# Patient Record
Sex: Male | Born: 1955 | Race: White | Hispanic: No | Marital: Married | Smoking: Former smoker
Health system: Southern US, Community
[De-identification: ages and names within clinical notes are randomized; demographics above are authoritative.]

## PROBLEM LIST (undated history)

## (undated) DIAGNOSIS — F419 Anxiety disorder, unspecified: Secondary | ICD-10-CM

## (undated) DIAGNOSIS — I10 Essential (primary) hypertension: Secondary | ICD-10-CM

## (undated) HISTORY — DX: Essential (primary) hypertension: I10

## (undated) HISTORY — DX: Anxiety disorder, unspecified: F41.9

## (undated) HISTORY — PX: SPINAL FUSION: SHX223

---

## 2014-02-08 ENCOUNTER — Observation Stay
Admission: EM | Admit: 2014-02-08 | Discharge: 2014-02-09 | Disposition: A | Discharge status: Home / Self Care | Payer: Medicare Other | Attending: Internal Medicine | Admitting: Internal Medicine

## 2014-02-08 ENCOUNTER — Emergency Department: Payer: Medicare Other

## 2014-02-08 ENCOUNTER — Observation Stay: Payer: Self-pay | Admitting: Internal Medicine

## 2014-02-08 DIAGNOSIS — R079 Chest pain, unspecified: Secondary | ICD-10-CM | POA: Diagnosis present

## 2014-02-08 DIAGNOSIS — I4510 Unspecified right bundle-branch block: Secondary | ICD-10-CM | POA: Insufficient documentation

## 2014-02-08 DIAGNOSIS — I498 Other specified cardiac arrhythmias: Secondary | ICD-10-CM | POA: Insufficient documentation

## 2014-02-08 DIAGNOSIS — F411 Generalized anxiety disorder: Secondary | ICD-10-CM | POA: Insufficient documentation

## 2014-02-08 DIAGNOSIS — R0789 Other chest pain: Principal | ICD-10-CM | POA: Insufficient documentation

## 2014-02-08 DIAGNOSIS — Z87891 Personal history of nicotine dependence: Secondary | ICD-10-CM | POA: Insufficient documentation

## 2014-02-08 DIAGNOSIS — I10 Essential (primary) hypertension: Secondary | ICD-10-CM | POA: Insufficient documentation

## 2014-02-08 DIAGNOSIS — F410 Panic disorder [episodic paroxysmal anxiety] without agoraphobia: Secondary | ICD-10-CM | POA: Insufficient documentation

## 2014-02-08 DIAGNOSIS — F419 Anxiety disorder, unspecified: Secondary | ICD-10-CM

## 2014-02-08 DIAGNOSIS — Z8249 Family history of ischemic heart disease and other diseases of the circulatory system: Secondary | ICD-10-CM | POA: Insufficient documentation

## 2014-02-08 LAB — BASIC METABOLIC PANEL
Anion Gap: 13 (ref 5.0–15.0)
BUN: 14 mg/dL (ref 9–28)
CO2: 22 mEq/L (ref 22–29)
Calcium: 9.9 mg/dL (ref 8.5–10.5)
Chloride: 107 mEq/L (ref 100–111)
Creatinine: 0.9 mg/dL (ref 0.7–1.3)
Glucose: 105 mg/dL — ABNORMAL HIGH (ref 70–100)
Potassium: 3.9 mEq/L (ref 3.5–5.1)
Sodium: 142 mEq/L (ref 136–145)

## 2014-02-08 LAB — CBC AND DIFFERENTIAL
Basophils Absolute Automated: 0.03 10*3/uL (ref 0.00–0.20)
Basophils Automated: 0 %
Eosinophils Absolute Automated: 0.22 10*3/uL (ref 0.00–0.70)
Eosinophils Automated: 3 %
Hematocrit: 39.7 % — ABNORMAL LOW (ref 42.0–52.0)
Hgb: 14.6 g/dL (ref 13.0–17.0)
Immature Granulocytes Absolute: 0.02 10*3/uL
Immature Granulocytes: 0 %
Lymphocytes Absolute Automated: 2.36 10*3/uL (ref 0.50–4.40)
Lymphocytes Automated: 30 %
MCH: 31.5 pg (ref 28.0–32.0)
MCHC: 36.8 g/dL — ABNORMAL HIGH (ref 32.0–36.0)
MCV: 85.7 fL (ref 80.0–100.0)
MPV: 10.2 fL (ref 9.4–12.3)
Monocytes Absolute Automated: 0.31 10*3/uL (ref 0.00–1.20)
Monocytes: 4 %
Neutrophils Absolute: 4.86 10*3/uL (ref 1.80–8.10)
Neutrophils: 62 %
Nucleated RBC: 0 /100 WBC (ref 0–1)
Platelets: 173 10*3/uL (ref 140–400)
RBC: 4.63 10*6/uL — ABNORMAL LOW (ref 4.70–6.00)
RDW: 13 % (ref 12–15)
WBC: 7.78 10*3/uL (ref 3.50–10.80)

## 2014-02-08 LAB — CK: Creatine Kinase (CK): 107 U/L (ref 47–267)

## 2014-02-08 LAB — GFR: EGFR: >60

## 2014-02-08 LAB — TROPONIN I: Troponin I: 0.01 ng/mL (ref 0.00–0.09)

## 2014-02-08 MED ORDER — LORAZEPAM 2 MG/ML IJ SOLN
0.5000 mg | Freq: Once | INTRAMUSCULAR | Status: AC
Start: 2014-02-08 — End: 2014-02-08
  Administered 2014-02-08: 0.5 mg via INTRAVENOUS
  Filled 2014-02-08: qty 1

## 2014-02-08 MED ORDER — HYDROMORPHONE HCL PF 1 MG/ML IJ SOLN
0.5000 mg | Freq: Once | INTRAMUSCULAR | Status: DC
Start: 2014-02-08 — End: 2014-02-08
  Filled 2014-02-08: qty 1

## 2014-02-08 MED ORDER — ASPIRIN 81 MG PO CHEW
162.0000 mg | CHEWABLE_TABLET | Freq: Once | ORAL | Status: AC
Start: 2014-02-08 — End: 2014-02-08
  Administered 2014-02-08: 162 mg via ORAL
  Filled 2014-02-08: qty 2

## 2014-02-08 MED ORDER — AMLODIPINE BESYLATE 5 MG PO TABS
5.0000 mg | ORAL_TABLET | Freq: Every day | ORAL | Status: DC
Start: 2014-02-09 — End: 2014-02-09
  Administered 2014-02-09: 5 mg via ORAL
  Filled 2014-02-08: qty 1

## 2014-02-08 MED ORDER — ACETAMINOPHEN 325 MG PO TABS
650.0000 mg | ORAL_TABLET | ORAL | Status: DC | PRN
Start: 2014-02-08 — End: 2014-02-09

## 2014-02-08 MED ORDER — HYDROCHLOROTHIAZIDE 25 MG PO TABS
25.0000 mg | ORAL_TABLET | Freq: Every day | ORAL | Status: DC
Start: 2014-02-09 — End: 2014-02-09
  Administered 2014-02-09: 25 mg via ORAL
  Filled 2014-02-08: qty 1

## 2014-02-08 MED ORDER — ONDANSETRON HCL 4 MG/2ML IJ SOLN
4.0000 mg | Freq: Once | INTRAMUSCULAR | Status: DC
Start: 2014-02-08 — End: 2014-02-08
  Filled 2014-02-08: qty 2

## 2014-02-08 MED ORDER — MORPHINE SULFATE 2 MG/ML IJ/IV SOLN (WRAP)
1.0000 mg | Status: DC | PRN
Start: 2014-02-08 — End: 2014-02-09

## 2014-02-08 MED ORDER — METOPROLOL TARTRATE 25 MG PO TABS
12.5000 mg | ORAL_TABLET | Freq: Two times a day (BID) | ORAL | Status: DC
Start: 2014-02-08 — End: 2014-02-08

## 2014-02-08 MED ORDER — ALPRAZOLAM 0.25 MG PO TABS
0.2500 mg | ORAL_TABLET | Freq: Two times a day (BID) | ORAL | Status: DC | PRN
Start: 2014-02-08 — End: 2014-02-09

## 2014-02-08 MED ORDER — ASPIRIN 81 MG PO CHEW
81.0000 mg | CHEWABLE_TABLET | Freq: Every day | ORAL | Status: DC
Start: 2014-02-09 — End: 2014-02-09
  Administered 2014-02-09: 81 mg via ORAL
  Filled 2014-02-08: qty 1

## 2014-02-08 NOTE — ED Notes (Signed)
Family member reports pt. Has been dealing with anxiety recently. Pt. Tearful in triage. States pt. Has had episodes of panic in the past, but this incident was lasted several hours. Denies SI

## 2014-02-08 NOTE — ED Provider Notes (Signed)
Physician/Midlevel provider first contact with patient: 02/08/14 1925         [facility] EMERGENCY DEPARTMENT HISTORY AND PHYSICAL EXAM    Patient Name: Aaron Mayo, Aaron Mayo  Encounter Date:  02/08/2014  Rendering Provider: Dorethea Clan, MD  Patient DOB:  1956-04-24  MRN:  [id number]    History of Presenting Illness     Historian: Patient  Onset: Gradual    58 y.o. male h/o anxiety and HTN presents for evaluation of a panic attack that began just PTA. Associated with CP and abd pain (resolved). Reports the episode of chest pain lasted for three seconds and radiated to his jaw before spontaneously resolving 3 hours PTA. States that he is under a significant amount of stress related to his job. Denies SI, HI, alcohol or drug use, or previous issues with addiction.     PMD:  Pcp, [facility], MD    Past Medical History     Past Medical History   Diagnosis Date   . Anxiety    . Hypertension        Past Surgical History     Past Surgical History   Procedure Laterality Date   . Spinal fusion         Family History     No family history on file.    Social History     History     Social History   . Marital Status: Married     Spouse Name: N/A     Number of Children: N/A   . Years of Education: N/A     Social History Main Topics   . Smoking status: Never Smoker    . Smokeless tobacco: Not on file   . Alcohol Use: No   . Drug Use: No   . Sexual Activity: Not on file     Other Topics Concern   . Not on file     Social History Narrative   . No narrative on file       Home Medications     Home medications reviewed by ED MD at 7:25 PM     Previous Medications    HYDROCHLOROTHIAZIDE (HYDRODIURIL) 12.5 MG TABLET    Take 12.5 mg by mouth daily.         Review of Systems     CV:  + CP radiating to jaw  GI: + abd pain  Psych:  No SI, HI, or substance abuse  All other systems reviewed and negative     Physical Exam     CONSTITUTIONAL Patient is afebrile, Vital signs reviewed, Tearful, Alert and oriented X 3.  HEAD Atraumatic,  Normocephalic.  EYES Eyes are normal to inspection, PERRL, No discharge from eyes,  Extraocular muscles intact, Sclera are normal, Conjunctiva are normal.  ENT Posterior pharynx normal, Mouth normal to inspection.  NECK Normal ROM, No meningeal signs, Cervical spine nontender.  RESPIRATORY CHEST Breath sounds normal, No respiratory distress.  CARDIOVASCULAR   RRR, No murmurs, Normal S1 S2, No rub.  ABDOMEN Abdomen is nontender, No distension.  UPPER EXTREMITY Inspection normal, Normal range of motion.   LOWER EXTREMITY Inspection normal, Normal range of motion.   NEURO GCS is 15  SKIN Skin is warm. Skin is dry.   PSYCHIATRIC Oriented X 3, Normal affect.    ED Medications Administered     ED Medication Orders    Start     Status Ordering Provider    02/08/14 2100  aspirin chewable tablet 162 mg  Once     Route: Oral  Ordered Dose: 162 mg     Ordered Neita Carp, Carlinda Ohlson E    02/08/14 2056  LORazepam (ATIVAN) injection 0.5 mg   Once     Route: Intravenous  Ordered Dose: 0.5 mg     Peyton Bottoms, Naasia Weilbacher E    02/08/14 1959     Once,   Status:  Discontinued     Route: Intravenous  Ordered Dose: 0.5 mg     Discontinued Neita Carp, Aylen Stradford E    02/08/14 1959     Once,   Status:  Discontinued     Route: Intravenous  Ordered Dose: 4 mg     Discontinued Jaclynne Baldo E    02/08/14 1956  LORazepam (ATIVAN) injection 0.5 mg   Once     Route: Intravenous  Ordered Dose: 0.5 mg     Last MAR action:  Given Rafiel Mecca E          Orders Placed During This Encounter     Orders Placed This Encounter   Procedures   . Chest AP Portable   . Basic Metabolic Panel   . CBC with differential   . CK with MB if indicated   . Troponin I   . GFR   . Cardiac monitoring   . ECG 12 lead   . Saline lock IV   . Einar Gip ED Bed Request       Diagnostic Study Results     The results of the diagnostic studies below were reviewed by the ED provider:    Labs  Results    Procedure Component Value Units Date/Time    Basic Metabolic Panel [[id number]]  (Abnormal)  Collected:  02/08/14 2017    Specimen Information:  Blood Updated:  02/08/14 2045     Glucose 105 (H) mg/dL      BUN 14 mg/dL      Creatinine 0.9 mg/dL      CALCIUM 9.9 mg/dL      Sodium 409 mEq/L      Potassium 3.9 mEq/L      Chloride 107 mEq/L      CO2 22 mEq/L      Anion Gap 13.0     CK with MB if indicated [[id number]] Collected:  02/08/14 2017    Specimen Information:  Blood Updated:  02/08/14 2045     Creatine Kinase (CK) 107 U/L     Troponin I [[id number]] Collected:  02/08/14 2017    Specimen Information:  Blood Updated:  02/08/14 2045     Troponin I 0.01 ng/mL     GFR [[id number]] Collected:  02/08/14 2017     EGFR >60.0 Updated:  02/08/14 2045    CBC with differential [[id number]]  (Abnormal) Collected:  02/08/14 2017    Specimen Information:  Blood / Blood Updated:  02/08/14 2026     WBC 7.78 x10 3/uL      RBC 4.63 (L) x10 6/uL      Hgb 14.6 g/dL      Hematocrit 01.0 (L) %      MCV 85.7 fL      MCH 31.5 pg      MCHC 36.8 (H) g/dL      RDW 13 %      Platelets 173 x10 3/uL      MPV 10.2 fL      Neutrophils 62 %      Lymphocytes Automated 30 %      Monocytes 4 %  Eosinophils Automated 3 %      Basophils Automated 0 %      Immature Granulocyte 0 %      Nucleated RBC 0 /100 WBC      Neutrophils Absolute 4.86 x10 3/uL      Abs Lymph Automated 2.36 x10 3/uL      Abs Mono Automated 0.31 x10 3/uL      Abs Eos Automated 0.22 x10 3/uL      Absolute Baso Automated 0.03 x10 3/uL      Absolute Immature Granulocyte 0.02 x10 3/uL           Radiologic Studies  Radiology Results (24 Hour)    Procedure Component Value Units Date/Time    Chest AP Portable [[id number]] Collected:  02/08/14 2021    Order Status:  Completed  Updated:  02/08/14 2025    Narrative:      History:  Chest pain. Hypertension.    Portable Chest: The heart size and mediastinum appear normal for the AP  projection and patient position.  The lungs are clear, with normal  pulmonary vascularity.  No pleural effusion, pneumothorax or acute  fracture is  evident on this study.      Impression:        Normal portable chest.          Nelta Numbers, MD   02/08/2014 8:21 PM            Scribe and MD Attestations     I, Dorethea Clan, MD, personally performed the services documented. Adonis Huguenin is scribing for me on [employer]. I reviewed and confirm the accuracy of the information in this medical record.    I, Adonis Huguenin, am serving as a scribe to document services personally performed by Dorethea Clan, MD, based on the provider's statements to me.     Rendering Provider: Dorethea Clan, MD    Monitors, EKG, Critical Care, and Splints     EKG (interpreted by ED physician): NSR, RBBB, Flipped T waves in V2 and V3. No old EKG for comparison.  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 Will admit. Discussed treatment plan.     Consults:  @2100 : D/w Dr. Cherylin Mylar, hospitalist, who will admit.     Diagnosis and Disposition     Clinical Impression  1. Chest pain    2. Anxiety        Disposition  ED Disposition    Admit Bed Type: Telemetry [5]  Admitting Physician: Shelby Mattocks [[id number]]  Patient Class: Observation [104]            Prescriptions       New Prescriptions    No medications on file               Dorethea Clan, MD  02/13/14 1311

## 2014-02-08 NOTE — Progress Notes (Signed)
Pt admitted to CDU from ED in stable condition.  Pt oriented to room, call bell, bed controls and unit. A&Ox4. VSS. Denies CP or SOB at present time. Pt informed of plan of care, including hourly rounding. Pt verbalizes understanding of plan. RN will continue to monitor.

## 2014-02-08 NOTE — H&P (Signed)
Patient Type: V     ATTENDING PHYSICIAN: Andi Hence, MD     CHIEF COMPLAINT:  Panic attack and chest pain.     HISTORY OF PRESENT ILLNESS:  The patient is 58 years old, with history of anxiety, hypertension, who  presented for further evaluation of the above symptoms.  The patient  reports that he is under a lot of stress at work.  He remodels houses for a  living, and he reports that he has been under a lot of stress lately.  He  came in to the ER today after experiencing symptoms of a panic attack that  began prior to coming to the ER.  He reports that he was feeling very  anxious, and he also had chest pain which was located on the left side of  the chest and was radiating to his jaw, which lasted for about 3 seconds.   The patient reports that he did not have any other associated symptoms.  He  reports the chest pain resolved.  The patient denies being suicidal or  homicidal.  He denies any substance abuse or any previous issues with  addiction.  At the time of my evaluation, he reports feeling much better.   He reports he has not had any recurrence of the chest pain.  He is now  feeling calm and sleepy.  He reports that he is not feeling panicky  anymore.     PAST MEDICAL HISTORY:  History of anxiety and hypertension and panic attacks.     PAST SURGICAL HISTORY:  He has had spinal fusion, remotely.     HOME MEDICATIONS:  He reports he takes hydrochlorothiazide 25 mg daily, and he also takes baby  aspirin daily, and he reports he takes another blood pressure medication  that he does not recall the name.     ALLERGIES:  No known drug allergies.     SOCIAL HISTORY:  He works in remodeling houses.  He is denying any tobacco use, alcohol use,  or drug use.     FAMILY HISTORY:  He denies any significant medical conditions in the family.  He denies any  history of heart attacks or suicidal ideation.     REVIEW OF SYSTEMS:  Constitutional, HEENT, pulmonary, cardiovascular, gastrointenstinal,  genitourinary,  musculoskeletal, endocrine, neurologic, psychiatric, ID,  hematologic, endocrine negative except as mentioned per history of present  illness.  Rest of review of systems is negative except as mentioned per  history of present illness.     PHYSICAL EXAMINATION:  VITAL SIGNS:  Blood pressure of 155/95, pulse of 68, respiratory rate of  15, temperature 97.9, saturating at 98%.  GENERAL:  He is sleepy but easily aroused.  He is in no acute distress.  By  the emergency room physician, when he initially presented, patient was  anxious and tearful.  Currently, he is calm and sleepy, in no acute  distress.  SKIN:  Negative for any rashes or bruises.  HEENT:  Within normal limits.  No pharyngeal erythema.  Normocephalic,  atraumatic.  Pupils equal, round, and reactive to light.  NECK:  Supple, no lymphadenopathy, no thyromegaly, no JVD.  LUNGS:  Clear to auscultation bilaterally, no wheezing, no crackles, no  rhonchi.  CARDIOVASCULAR:  Regular rate and rhythm, no murmurs, rubs, or gallops.  ABDOMEN:  Soft, nondistended, nontender, normoactive bowel sounds.  No  rigidity, no guarding, no rebound.  EXTREMITIES:  No edema, no clubbing, no cyanosis.  NEUROLOGIC:  He is sleepy but  arousable.  He has no focal findings on  neurologic exam.  PSYCHIATRIC:  Appropriate affect.     LABORATORY AND DIAGNOSTIC DATA:  White count of 7.78, hemoglobin and hematocrit of 14.6 and 39.7, platelets  of 173.  Sodium and potassium of 142 and 3.9, chloride and bicarb of 107  and 22, BUN and creatinine of 14 and 0.9.  Troponin 0.01, CK 107.  Chest  x-ray normal.  EKG showed incomplete right bundle branch block and sinus  bradycardia.  There are no old EKGs to compare to.     ASSESSMENT AND PLAN:  The patient is 59 years old with history of panic attacks, anxiety,  hypertension, who presented with a transient episode of left-sided chest  pain.  The patient also reports being under a lot of stress at work and  apparently was noted to be tearful in the  emergency room.  1.  Transient left-sided chest pain; possibly this could be due to anxiety.   The patient reports the symptoms only lasted for a few seconds, resolved  since admission, and has not had recurrence of symptoms.  The patient was given  Ativan in the ER and aspirin.  He reports that symptoms resolved.  He is  not feeling anxious anymore, and currently, he is feeling fine.  The  patient will be observed in the CDU unit.  We will check serial enzymes,  and he will be monitored on telemetry.  We will continue with home regimen  of hydrochlorothiazide, will also put him on aspirin and low-dose Lopressor  for hypertension.    2.  For hypertension:  Blood pressures are noted to be elevated; could be  related to anxiety.  He will be continued on his home regimen of  hydrochlorothiazide.  He does not recall the second medication he takes.   He will be placed on low-dose Lopressor for now.  3.  For anxiety and panic attacks.  Currently, he reports feeling fine.  He  reports being under a lot of stress at work.  He denies any suicidal  ideation or homicidal ideation.  He denies being depressed.  He will be  given Xanax p.r.n. anxiety.  He will need to follow up with primary care  doctor and/or psychiatrist outpatient.  Further management will depend on  patient's clinical progress.           D:  02/08/2014 23:10 PM by Dr. Nadara Mustard. Cherylin Mylar, MD ([id number])  T:  02/08/2014 23:40 PM by       Everlean Cherry: [id number]) (Doc ID: [id number])

## 2014-02-08 NOTE — Plan of Care (Signed)
Problem: Chest Pain  Goal: Vital signs and cardiac rhythm stable  Outcome: Progressing  A&Ox4. VSS. SB with BBB on monitor. Pt denies any CP or SOB. His only complaints are anxiety and mild headache. Pt sleeping comfortable in bed. Plan for trend troponin, cardiac monitoring, and rest. Pt aware of plan of care. RN will continue to monitor.

## 2014-02-09 ENCOUNTER — Other Ambulatory Visit: Payer: Self-pay

## 2014-02-09 LAB — CBC
Hematocrit: 38.3 % — ABNORMAL LOW (ref 42.0–52.0)
Hgb: 13.9 g/dL (ref 13.0–17.0)
MCH: 31.2 pg (ref 28.0–32.0)
MCHC: 36.3 g/dL — ABNORMAL HIGH (ref 32.0–36.0)
MCV: 86.1 fL (ref 80.0–100.0)
MPV: 10.2 fL (ref 9.4–12.3)
Nucleated RBC: 0 /100 WBC (ref 0–1)
Platelets: 163 10*3/uL (ref 140–400)
RBC: 4.45 10*6/uL — ABNORMAL LOW (ref 4.70–6.00)
RDW: 13 % (ref 12–15)
WBC: 7.62 10*3/uL (ref 3.50–10.80)

## 2014-02-09 LAB — BASIC METABOLIC PANEL
Anion Gap: 10 (ref 5.0–15.0)
BUN: 13 mg/dL (ref 9–28)
CO2: 24 mEq/L (ref 22–29)
Calcium: 9.4 mg/dL (ref 8.5–10.5)
Chloride: 107 mEq/L (ref 100–111)
Creatinine: 1 mg/dL (ref 0.7–1.3)
Glucose: 106 mg/dL — ABNORMAL HIGH (ref 70–100)
Potassium: 4.1 mEq/L (ref 3.5–5.1)
Sodium: 141 mEq/L (ref 136–145)

## 2014-02-09 LAB — TROPONIN I
Troponin I: 0.02 ng/mL (ref 0.00–0.09)
Troponin I: <0.01 ng/mL (ref 0.00–0.09)

## 2014-02-09 LAB — GFR: EGFR: >60

## 2014-02-09 LAB — LIPID PANEL
Cholesterol / HDL Ratio: 4.1
Cholesterol: 132 mg/dL (ref 0–199)
HDL: 32 mg/dL — ABNORMAL LOW (ref >40)
LDL Calculated: 84 mg/dL (ref 0–99)
Triglycerides: 81 mg/dL (ref 34–149)
VLDL Calculated: 16 mg/dL (ref 10–40)

## 2014-02-09 LAB — ECG 12-LEAD
Atrial Rate: 52 {beats}/min
P Axis: 45 degrees
P-R Interval: 206 ms
Q-T Interval: 444 ms
QRS Duration: 114 ms
QTC Calculation (Bezet): 412 ms
R Axis: 65 degrees
T Axis: 43 degrees
Ventricular Rate: 52 {beats}/min

## 2014-02-09 LAB — CK: Creatine Kinase (CK): 84 U/L (ref 47–267)

## 2014-02-09 LAB — HEMOLYSIS INDEX: Hemolysis Index: 8 (ref 0–18)

## 2014-02-09 MED ORDER — ALPRAZOLAM 0.25 MG PO TABS
0.2500 mg | ORAL_TABLET | Freq: Three times a day (TID) | ORAL | 0 refills | Status: AC | PRN
Start: 2014-02-09 — End: ?
  Filled 2014-02-09: qty 30, 10d supply, fill #0

## 2014-02-09 NOTE — Plan of Care (Signed)
Pt is A&O x 4. VSS. Denied any chest pain or discomfort. Third troponin negative. SB in the 40-50's per. Cleared by cardiology. Wife at bedside. Pt is up for D/c.

## 2014-02-09 NOTE — Discharge Instructions (Signed)
Diet: low fat low salt  Activity: as tolerated

## 2014-02-09 NOTE — Discharge Summary (Signed)
[facility]      Patient: Aaron Mayo  Admission Date: 02/08/2014   DOB: [birth year]  Discharge Date: 02/09/2014    MRN: [id number]  Discharge Attending: Rayna Sexton, MD   Referring Physician: Bobby Rumpf, MD  PCP: Bobby Rumpf, MD       DISCHARGE SUMMARY     Discharge Information   Admission Diagnosis:   Chest pain    Discharge Diagnosis:   Patient Active Problem List    Diagnosis Date Noted   . Chest pain 02/08/2014    Anxiety     Sinus Bradycardia         Admission Condition: fair  Discharge Condition: stable  Functional Status: independent    Discharge Medications:     Medication List      START taking these medications         ALPRAZolam 0.25 MG tablet   Commonly known as:  XANAX   Take 1 tablet (0.25 mg total) by mouth 3 (three) times daily as needed for Anxiety.         CONTINUE taking these medications         AMLODIPINE BESYLATE PO       aspirin 81 MG tablet       hydrochlorothiazide 12.5 MG tablet   Commonly known as:  HYDRODIURIL         Where to Get Your Medications    These are the prescriptions that you need to pick up.        You may get the following medications from any pharmacy   -  ALPRAZolam 0.25 MG tablet                          Hospital Course   Presentation History   The patient is 58 years old, with history of anxiety and hypertension who  presented with chest pain and panic attack.    See HPI for details.    Hospital Course (1 Days)     Chest pain  - no further symptoms, ambulating without difficulty  - EKG showed SR with incomplete RBBB, mild anterior TWI  - unremarkable Troponin x 3, negative CXR for abnormality, lipid panel reviewed  - recent negative nuclear stress test at [facility] about a month ago   - evaluated by cardiology, above EKG likely a normal variant, atypical chest pain, stable to be discharged home   - likely related to anxiety and panic attack  - recommended stress management     Hypertension  - initially high, now improved, cont Norvasc and  HCTZ  - monitor blood pressure at home, record it, and take it to follow up appointment    Sinus bradycardia  - as low as 36 bpm while asleep, stable HR in 50-60s while awake and with activity  - evaluated by cardiology, personally ambulated pt without symptoms, stable, no need for further evaluation    Anxiety, panic attack  - improved with ativan, try xanax PRN at home, encouraged stress management     DVT prophylaxis  - SCD, ambulation      Procedures/Imaging:   Chest AP Portable   Final Result   History:  Chest pain. Hypertension.      Portable Chest: The heart size and mediastinum appear normal for the AP   projection and patient position.  The lungs are clear, with normal   pulmonary vascularity.  No pleural effusion, pneumothorax or acute   fracture is evident  on this study.      IMPRESSION:      Normal portable chest.               Nelta Numbers, MD    02/08/2014 8:21 PM             Treatment Team:   Attending Provider: Rayna Sexton, MD  Consulting Physician: Shelby Mattocks, MD       Best Practices   Was the patient admitted with either a CHF Exacerbation or Pneumonia? no     Progress Note/Physical Exam at Discharge     Subjective: no further chest pain, improved anxiety, feels well now  Denies palpitations, dizziness, SOB, abdominal pain, or nausea  Denies leg swelling or pain    Filed Vitals:    02/08/14 2120 02/08/14 2151 02/09/14 0343 02/09/14 0734   BP: 148/83 155/95 135/76 117/73   Pulse: 57 60 56 48   Temp:  97.9 F (36.6 C) 96.6 F (35.9 C) 97.7 F (36.5 C)   TempSrc:  Oral Oral Oral   Resp: 18 15 16 18    Height:  1.778 m (5\' 10" )     Weight:  116.574 kg (257 lb)     SpO2: 97% 98% 96% 97%       CONSTITUTIONAL: no acute distress, resting comfortably in bed, witnessed ambulating comfortably  HEENT: normocephalic, clear conjunctiva  NECK: Supple, midline trachea  CARDIOVASCULAR: RRR, normal S1, S2; No murmur, rub, gallop  LUNGS: normal rate and effort, clear to auscultation; No Wheeze,  rhonchi, or rale  ABDOMEN: soft, non-tender, positive bowel sounds, no rebound or guarding  GENITOURINARY: no suprapubic or CVA tenderness  MUSCULOSKELETAL: intact ROM and motor strength, no obvious deformities  EXTREMITIES: no edema, no calf tenderness, intact pedal pulses  SKIN: no rash or ecchymosis  NEURO: A&Ox3, no focal neurological deficits  PSYCH: calm and cooperative at this time     Diagnostics     Labs/Studies Pending at Discharge: no    Last Labs     Recent Labs  Lab 02/09/14  0248 02/08/14  2017   WBC 7.62 7.78   RBC 4.45* 4.63*   Hgb 13.9 14.6   Hematocrit 38.3* 39.7*   MCV 86.1 85.7   Platelets 163 173         Recent Labs  Lab 02/09/14  0248 02/08/14  2017   Sodium 141 142   Potassium 4.1 3.9   Chloride 107 107   CO2 24 22   BUN 13 14   Creatinine 1.0 0.9   Glucose 106* 105*   CALCIUM 9.4 9.9             Microbiology Results    None           Patient Instructions   Discharge Diet: low fat, low salt  Discharge Activity:  As tolerated    Follow Up Appointment:  Follow-up Information    Follow up with Pcp, [facility], MD In 1 week.           Time spent examining patient, discussing with patient/family regarding hospital course, chart review, reconciling medications and discharge planning: 45 minutes.    Signed,  Jena Gauss, NP  12:08 PM 02/09/2014

## 2014-02-09 NOTE — Progress Notes (Signed)
D/c instruction and script given to pt. RN educated pt on side effect of new med. Pt and family verbalized understandings. All questions and concerns addressed. Pt refused for a W/C. Pt is ambulatory. Left with wife @ 1240.

## 2014-02-09 NOTE — Consults (Signed)
CARDIOLOGY CONSULTATION  [facility]    Date Time: 02/09/2014 11:19 AM  Patient Name: Aaron Mayo  Requesting Physician: Rayna Sexton, MD       Reason for Consultation:   Chest pain      Impression:      Admitted with atypical CP   Ruled out for MI   Weight loss 50 lbs over last 3 month with increased activity at work   Significant personal stressors   Recent negative ex nuclear stress test at the [place] per pt   HTN   CXR: negative   EKG:  SR, incomplete RBBB, mild anterior TWI, likely normal variant    Recommendation:      May Osseo from CV standpoint   Biofeedback strongly encouraged   BP diary over next month at home and consider decreasing BP meds if possible as an outpt  History:   Aaron Mayo is a 58 y.o. male who presents to the hospital on 02/08/2014 with onset of emotional distress, CP. 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 No vacations. Living apart from wife as the site work is in [place]. Yesterday approx 2 PM, he was under excess stress, was talking with wife about future plans, and felt a sensation in the pit of his stomach rise into his chest to R side of neck. Lasted 30 seconds then resolved. He then was sobbing excessively and felt he was having a panic attack. No current sx. Nocturnal sinus brady on tele overnight.  Has had up to 3 stress tests at the [place] in the last 5 years all neg.     Pt was ambulated up and down 3 flights of stairs 3 times with no sx this afternoon. Personally escorted the patient.     Past Medical History:     Past Medical History   Diagnosis Date   . Anxiety    . Hypertension        Past Surgical History:     Past Surgical History   Procedure Laterality Date   . Spinal fusion         Family History:     Family History   Problem Relation Age of Onset   . Heart disease Mother    . Heart disease Father        Social History:      History     Social History   . Marital Status: Married     Spouse Name: N/A     Number of Children: N/A   . Years of Education: N/A     Social History Main Topics   . Smoking status: Former Smoker     Quit date: 02/08/2013   . Smokeless tobacco: Not on file   . Alcohol Use: No   . Drug Use: No   . Sexual Activity: Not on file     Other Topics Concern   . Not on file     Social History Narrative   . No narrative on file       Allergies:   No Known Allergies    Medications:     Prescriptions prior to admission   Medication Sig   . AMLODIPINE BESYLATE PO Take by mouth.   Marland Kitchen aspirin 81 MG tablet Take 81 mg by mouth daily.   . hydrochlorothiazide (HYDRODIURIL) 12.5  MG tablet Take 25 mg by mouth daily.           Review of Systems:   Constitional, head, eyes, ears, nose, throat, musculoskeletal, skin, neuro, endo, psych, heme, all negative unless noted in HPI.    Physical Exam:   BP 117/73   Pulse 48   Temp(Src) 97.7 F (36.5 C) (Oral)   Resp 18   Ht 1.778 m (5\' 10" )   Wt 116.574 kg (257 lb)   BMI 36.88 kg/m2     SpO2 97%   Temp (24hrs), Avg:97.6 F (36.4 C), Min:96.6 F (35.9 C), Max:98 F (36.7 C)      Intake and Output Summary (Last 24 hours) at Date Time  No intake or output data in the 24 hours ending 02/09/14 1119    General Appearance:  Breathing comfortably, no acute distress, obese  Head:  Normocephalic  Eyes:  Extraocular eye movements intact  Neck:  No carotid bruit, Cannot assess jugular venous distention due to body habitus, brisk carotid upstroke  Lungs:  Clear to auscultation throughout, no wheezes, rhonchi or rales, good respiratory effort   Chest Wall:  No tenderness or deformity  Heart:  S1, S2 normal, no S3, no S4, no murmur, PMI not displaced, no rub  Abdomen:  Soft, non-tender, positive bowel sounds  Extremities:  No cyanosis, clubbing or edema  Pulses:  Equal radial pulses, 4/4 symmetric  Neurologic:  Alert and oriented x3, mood and affect normal        Labs Reviewed:       Recent  Labs  Lab 02/09/14  1010 02/09/14  0248 02/08/14  2017   Creatine Kinase (CK)  --  84 107   Troponin I <0.01 0.02 0.01     No results for input(s): DIG in the last 168 hours.    Recent Labs  Lab 02/09/14  0248   Cholesterol 132   Triglycerides 81   HDL 32*   LDL Calculated 84     No results for input(s): BILITOTAL, BILIDIRECT, PROT, ALB, ALT, AST in the last 168 hours.  No results for input(s): MG in the last 168 hours.  No results for input(s): PT, INR, PTT in the last 168 hours.    Recent Labs  Lab 02/09/14  0248 02/08/14  2017   WBC 7.62 7.78   Hgb 13.9 14.6   Hematocrit 38.3* 39.7*   Platelets 163 173       Recent Labs  Lab 02/09/14  0248 02/08/14  2017   Sodium 141 142   Potassium 4.1 3.9   Chloride 107 107   CO2 24 22   BUN 13 14   Creatinine 1.0 0.9   EGFR >60.0 >60.0   Glucose 106* 105*   CALCIUM 9.4 9.9     No results for input(s): TSH, FREET3 in the last 168 hours.    Invalid input(s): FREET4    No results found for this basename: BNP            Signed by: Jari Pigg, MD [facility]    [facility]  NP Spectral[facility] [phone] (8am-5pm)  MD Spectralink [phone]  (8am-5pm)  After hours, non urgent consult line [phone]  After Hours, urgent consults [phone]
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
## 2021-09-28 DEATH — deceased
